# Patient Record
Sex: Female | Born: 1995 | Hispanic: Yes | Marital: Single | Smoking: Never smoker
Health system: Southern US, Community
[De-identification: ages and names within clinical notes are randomized; demographics above are authoritative.]

---

## 2019-05-06 ENCOUNTER — Emergency Department (HOSPITAL_COMMUNITY): Payer: Self-pay

## 2019-05-06 ENCOUNTER — Emergency Department (HOSPITAL_COMMUNITY)
Admission: EM | Admit: 2019-05-06 | Discharge: 2019-05-06 | Disposition: A | Discharge status: Home / Self Care | Payer: Self-pay | Attending: Emergency Medicine | Admitting: Emergency Medicine

## 2019-05-06 ENCOUNTER — Encounter (HOSPITAL_COMMUNITY): Payer: Self-pay | Admitting: Emergency Medicine

## 2019-05-06 ENCOUNTER — Other Ambulatory Visit: Payer: Self-pay

## 2019-05-06 DIAGNOSIS — S8002XA Contusion of left knee, initial encounter: Secondary | ICD-10-CM | POA: Insufficient documentation

## 2019-05-06 DIAGNOSIS — T07XXXA Unspecified multiple injuries, initial encounter: Secondary | ICD-10-CM | POA: Diagnosis present

## 2019-05-06 DIAGNOSIS — S161XXA Strain of muscle, fascia and tendon at neck level, initial encounter: Secondary | ICD-10-CM | POA: Diagnosis not present

## 2019-05-06 DIAGNOSIS — Y999 Unspecified external cause status: Secondary | ICD-10-CM | POA: Diagnosis not present

## 2019-05-06 DIAGNOSIS — Y9301 Activity, walking, marching and hiking: Secondary | ICD-10-CM | POA: Insufficient documentation

## 2019-05-06 DIAGNOSIS — S8001XA Contusion of right knee, initial encounter: Secondary | ICD-10-CM | POA: Insufficient documentation

## 2019-05-06 DIAGNOSIS — Y929 Unspecified place or not applicable: Secondary | ICD-10-CM | POA: Diagnosis not present
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
## 2019-05-06 NOTE — Progress Notes (Signed)
Orthopedic Tech Progress Note Patient Details:  Yesenia Gilbert 1995/08/02 [id number] MD called requesting a soft collar and a pair of crutches for patient Ortho Devices Type of Ortho Device: Crutches, Soft collar Ortho Device/Splint Location: neck Ortho Device/Splint Interventions: Application, Ordered   Post Interventions Patient Tolerated: Ambulated well, Well Instructions Provided: Poper ambulation with device, Care of device, Adjustment of device   Donald Pore 05/06/2019, 7:33 PM

## 2020-07-10 ENCOUNTER — Encounter: Payer: Self-pay | Admitting: Obstetrics & Gynecology

## 2020-07-10 ENCOUNTER — Telehealth: Payer: Self-pay | Admitting: General Practice

## 2020-07-10 ENCOUNTER — Other Ambulatory Visit: Payer: Self-pay

## 2020-07-10 ENCOUNTER — Other Ambulatory Visit (HOSPITAL_COMMUNITY)
Admission: RE | Admit: 2020-07-10 | Discharge: 2020-07-10 | Disposition: A | Discharge status: Home / Self Care | Payer: 59 | Source: Ambulatory Visit | Attending: Obstetrics & Gynecology | Admitting: Obstetrics & Gynecology

## 2020-07-10 ENCOUNTER — Ambulatory Visit (INDEPENDENT_AMBULATORY_CARE_PROVIDER_SITE_OTHER): Payer: 59 | Admitting: Obstetrics & Gynecology

## 2020-07-10 VITALS — BP 121/68 | HR 69 | Ht 63.5 in | Wt 160.0 lb

## 2020-07-10 DIAGNOSIS — Z113 Encounter for screening for infections with a predominantly sexual mode of transmission: Secondary | ICD-10-CM

## 2020-07-10 DIAGNOSIS — Z01419 Encounter for gynecological examination (general) (routine) without abnormal findings: Secondary | ICD-10-CM

## 2020-07-10 DIAGNOSIS — N6320 Unspecified lump in the left breast, unspecified quadrant: Secondary | ICD-10-CM

## 2020-07-10 DIAGNOSIS — Z3009 Encounter for other general counseling and advice on contraception: Secondary | ICD-10-CM

## 2020-07-10 NOTE — Progress Notes (Signed)
Patient has had nexpalnon for six years and wants to discuss a new one vs other birth control. Patient is former patient of [facility]- seeking care here because [facility] is no longer taking new patients. Armandina Stammer RN

## 2020-07-10 NOTE — Telephone Encounter (Signed)
Left message on VM informing patient of breast ultrasound scheduled on 08/04/2020 at 12:40pm with [facility].  Address and phone number was given to patient.

## 2020-07-10 NOTE — Progress Notes (Signed)
Subjective:     Yesenia Gilbert is a 25 y.o. female here for a routine exam.G1P0010  Current complaints: Pt has had Nexplanon for 7 years. She is on her 2nd Nexplanon. 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     Gynecologic History No LMP recorded. Patient has had an implant. Contraception: Nexplanon Last Pap: 4 years prev at [facility]. Results were: normal Last mammogram: 4 years prev. Told that she had a benign mass.   Obstetric History OB History  Gravida Para Term Preterm AB Living  1       1    SAB IAB Ectopic Multiple Live Births               # Outcome Date GA Lbr Len/2nd Weight Sex Delivery Anes PTL Lv  1 AB              The following portions of the patient's history were reviewed and updated as appropriate: allergies, current medications, past family history, past medical history, past social history, past surgical history and problem list.  Review of Systems Pertinent items are noted in HPI.    Objective:  BP 121/68   Pulse 69   Ht 5' 3.5" (1.613 m)   Wt 160 lb (72.6 kg)   BMI 27.90 kg/m  General Appearance:    Alert, cooperative, no distress, appears stated age  Head:    Normocephalic, without obvious abnormality, atraumatic  Eyes:    conjunctiva/corneas clear, EOM's intact, both eyes  Ears:    Normal external ear canals, both ears  Nose:   Nares normal, septum midline, mucosa normal, no drainage    or sinus tenderness  Throat:   Lips, mucosa, and tongue normal; teeth and gums normal  Neck:   Supple, symmetrical, trachea midline, no adenopathy;    thyroid:  no enlargement/tenderness/nodules  Back:     Symmetric, no curvature, ROM normal, no CVA tenderness  Lungs:     respirations unlabored  Chest Wall:    No tenderness or deformity   Heart:    Regular rate and rhythm  Breast Exam:    No tenderness, masses, or nipple abnormality. Mobile well circumscribed mass at 9:00 on the left medial chest wall/breast.    Abdomen:      Soft, non-tender, bowel sounds active all four quadrants,    no masses, no organomegaly  Genitalia:    Normal female without lesion, discharge or tenderness     Extremities:   Extremities normal, atraumatic, no cyanosis or edema  Pulses:   2+ and symmetric all extremities  Skin:   Skin color, texture, turgor normal, no rashes or lesions    Assessment:    Healthy female exam.    Plan:  Yesenia Gilbert was seen today for gynecologic exam.  Diagnoses and all orders for this visit:  Well female exam with routine gynecological exam -     Cytology - PAP( [facility])  Breast mass, left -     US BREAST LTD UNI LEFT INC AXILLA; Future  Encounter for counseling regarding contraception  Routine screening for STI (sexually transmitted infection)  pt to f/u for removal and reinsertion of Nexplanon.   Yesenia Gilbert, M.D., Evern Core

## 2020-07-12 LAB — CYTOLOGY - PAP
Chlamydia: NEGATIVE
Comment: NEGATIVE
Comment: NORMAL
Diagnosis: NEGATIVE
Neisseria Gonorrhea: NEGATIVE

## 2020-08-04 ENCOUNTER — Ambulatory Visit
Admission: RE | Admit: 2020-08-04 | Discharge: 2020-08-04 | Disposition: A | Discharge status: Home / Self Care | Payer: 59 | Source: Ambulatory Visit | Attending: Obstetrics & Gynecology | Admitting: Obstetrics & Gynecology

## 2020-08-04 ENCOUNTER — Other Ambulatory Visit: Payer: Self-pay

## 2020-08-04 DIAGNOSIS — N6320 Unspecified lump in the left breast, unspecified quadrant: Secondary | ICD-10-CM

## 2020-08-11 ENCOUNTER — Other Ambulatory Visit: Payer: Self-pay

## 2020-08-11 ENCOUNTER — Encounter: Payer: Self-pay | Admitting: Family Medicine

## 2020-08-11 ENCOUNTER — Ambulatory Visit (INDEPENDENT_AMBULATORY_CARE_PROVIDER_SITE_OTHER): Payer: 59 | Admitting: Family Medicine

## 2020-08-11 VITALS — BP 116/73 | HR 65 | Wt 160.0 lb

## 2020-08-11 DIAGNOSIS — Z30432 Encounter for removal of intrauterine contraceptive device: Secondary | ICD-10-CM | POA: Diagnosis not present

## 2020-08-11 DIAGNOSIS — Z30430 Encounter for insertion of intrauterine contraceptive device: Secondary | ICD-10-CM

## 2020-08-11 MED ORDER — ETONOGESTREL 68 MG ~~LOC~~ IMPL
68.0000 mg | DRUG_IMPLANT | Freq: Once | SUBCUTANEOUS | Status: AC
  Administered 2020-08-11: 68 mg via SUBCUTANEOUS

## 2020-08-11 NOTE — Progress Notes (Signed)
Nexplanon Removal:  Patient given informed consent for removal of her Implanon, time out was performed.  Signed copy in the chart.  Appropriate time out taken. Implanon site identified.  Area prepped in usual sterile fashon. One cc of 1% lidocaine was used to anesthetize the area at the distal end of the implant. A small stab incision was made right beside the implant on the distal portion.  The implanon rod was grasped using hemostats and removed without difficulty.  There was less than 3 cc blood loss. There were no complications.  A small amount of antibiotic ointment and steri-strips were applied over the small incision.  A pressure bandage was applied to reduce any bruising.  The patient tolerated the procedure well and was given post procedure instructions.  Nexplanon Insertion:  Patient given informed consent, signed copy in the chart, time out was performed.  Appropriate time out taken.  Nexplanon removed form packaging. Device confirmed in needle, then inserted full length of needle and withdrawn per handbook instructions.  Device palpated by physician and patient.  Pt insertion site covered with pressure dressing.   Minimal blood loss.  Pt tolerated the procedure well.

## 2021-06-06 ENCOUNTER — Ambulatory Visit (INDEPENDENT_AMBULATORY_CARE_PROVIDER_SITE_OTHER): Payer: Managed Care, Other (non HMO) | Admitting: Obstetrics & Gynecology

## 2021-06-06 ENCOUNTER — Encounter: Payer: Self-pay | Admitting: Obstetrics & Gynecology

## 2021-06-06 ENCOUNTER — Other Ambulatory Visit: Payer: Self-pay

## 2021-06-06 VITALS — BP 125/76 | HR 75 | Wt 152.0 lb

## 2021-06-06 DIAGNOSIS — R109 Unspecified abdominal pain: Secondary | ICD-10-CM

## 2021-06-06 NOTE — Progress Notes (Signed)
Patient complaining of feeling "off" since getting new Nexplanon. Patient complaining of pain on left side.   Patient also complaining of breast tenderness. Patient had breast imaging last April 2022. Kathrene Alu RN

## 2021-06-06 NOTE — Progress Notes (Signed)
History:  26 y.o. G1P0010 here today for pain in her left side. She reports that she's worried that its related to the Nexplanon. After questioning she recalls that she started working out around the time of the onset of the pain.    The following portions of the patient's history were reviewed and updated as appropriate: allergies, current medications, past family history, past medical history, past social history, past surgical history and problem list.  Review of Systems:  Pertinent items are noted in HPI.    Objective:  Physical Exam Blood pressure 125/76, pulse 75, weight 152 lb (68.9 kg), last menstrual period 04/09/2021.  CONSTITUTIONAL: Well-developed, well-nourished female in no acute distress.  HENT:  Normocephalic, atraumatic EYES: Conjunctivae and EOM are normal. No scleral icterus.  NECK: Normal range of motion SKIN: Skin is warm and dry. No rash noted. Not diaphoretic.No pallor. NEUROLGIC: Alert and oriented to person, place, and time. Normal coordination.  Abd: Soft, nontender and nondistended Pelvic: Normal appearing external genitalia; normal appearing vaginal mucosa and cervix.  Normal discharge.  Small uterus, no other palpable masses, no uterine or adnexal tenderness   Assessment & Plan:  Left side pain. Gyn exam WNL. Nothing that seems related to the Nexplanon.  Pt reassured.   Rec NSAIDS and a heating pad.  F/u prn  Yesenia Gilbert, M.D., [place] Core

## 2021-07-10 ENCOUNTER — Telehealth: Payer: Self-pay

## 2021-07-10 NOTE — Telephone Encounter (Signed)
Pt called stating she thinks she is having pain near her kidneys. Pt states she is not having UTI symptoms. Pt was given the number to [facility]. Understanding was voiced. ?Brei Pociask l Dago Jungwirth, CMA  ?

## 2021-07-10 NOTE — Telephone Encounter (Signed)
-----   Message from Maurine Minister, [place] sent at 07/10/2021  9:19 AM EDT ----- ?Regarding: Request Call Back ?Patient is having pain in lower back and side.  Also requesting referral to nephrologist. ? ?

## 2021-08-08 ENCOUNTER — Ambulatory Visit: Payer: Managed Care, Other (non HMO) | Admitting: Obstetrics & Gynecology

## 2022-01-26 IMAGING — US US BREAST*L* LIMITED INC AXILLA
1 series · 11 of 11 positions shown · non-contrast
Comparison: None.

CLINICAL DATA: Patient presents with a lump along the medial margin
of the left breast. She reports that this has been present for years
and that she had evaluated in [HOSPITAL] in 8778, and was told that
it was a cyst. At this time, there no prior images available [REDACTED] or elsewhere for this patient.

EXAM:
ULTRASOUND OF THE LEFT BREAST

[Series 1: us breast*left* limited inc axilla · 0.06mm/px · 11 of 11 slices shown]
[im 1/11]
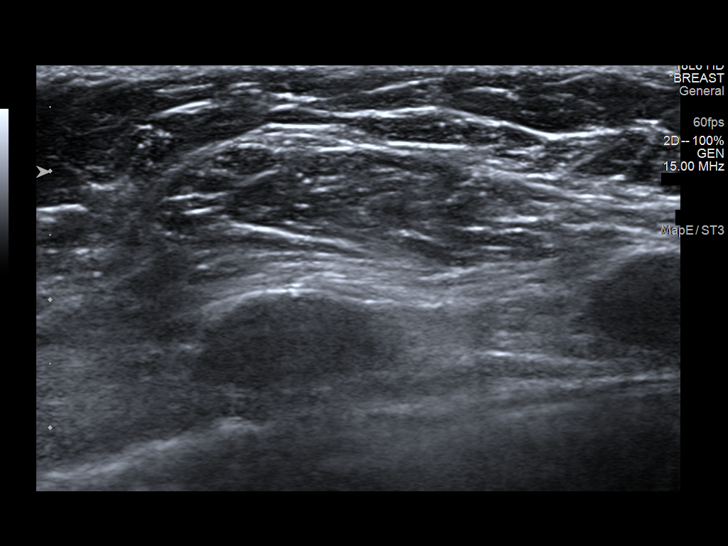
[im 2/11]
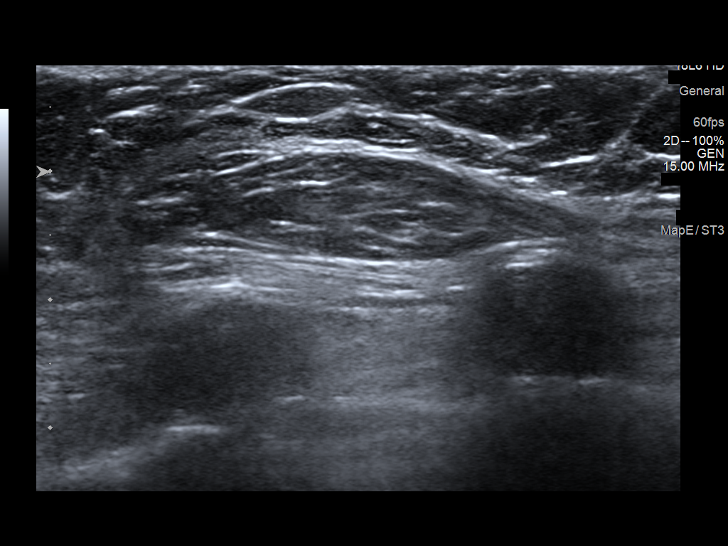
[im 3/11]
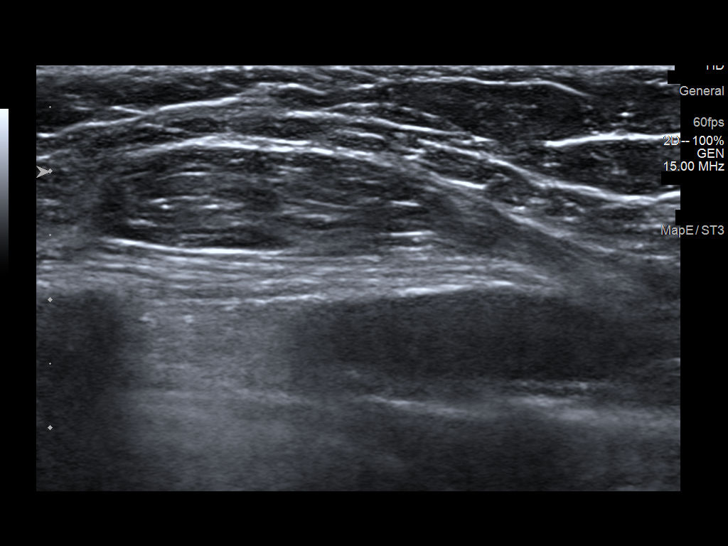
[im 4/11]
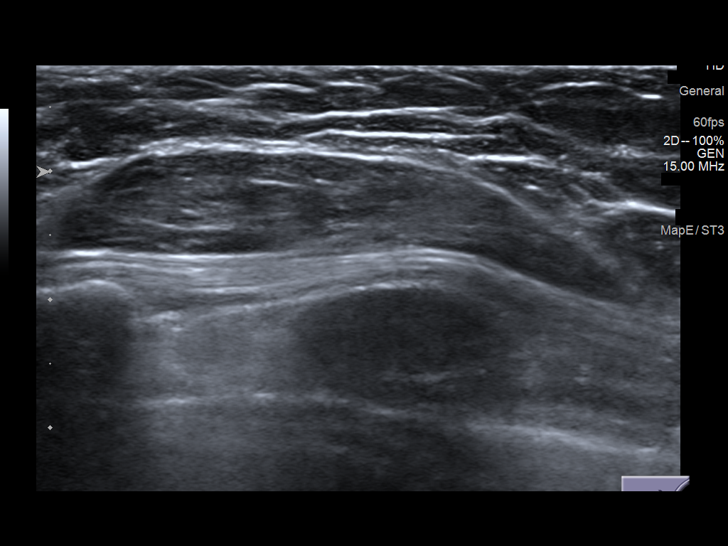
[im 5/11]
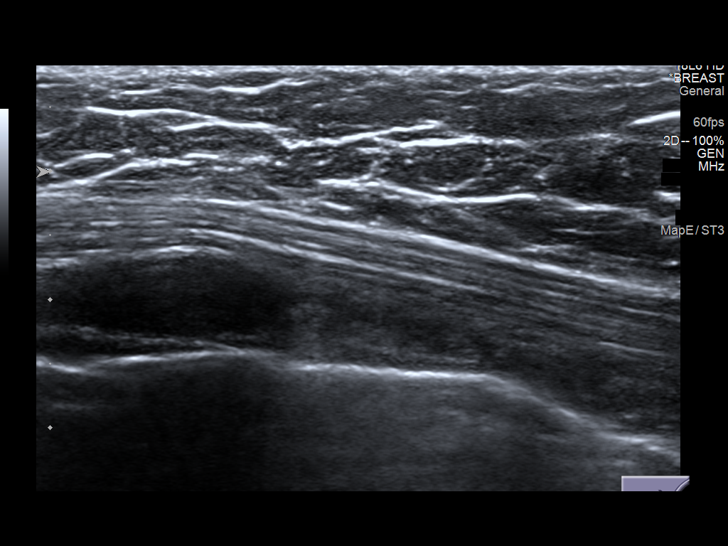
[im 6/11]
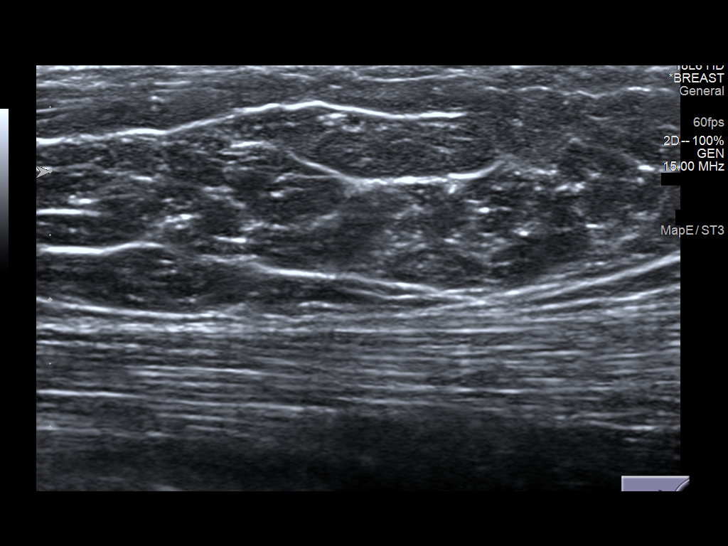
[im 7/11]
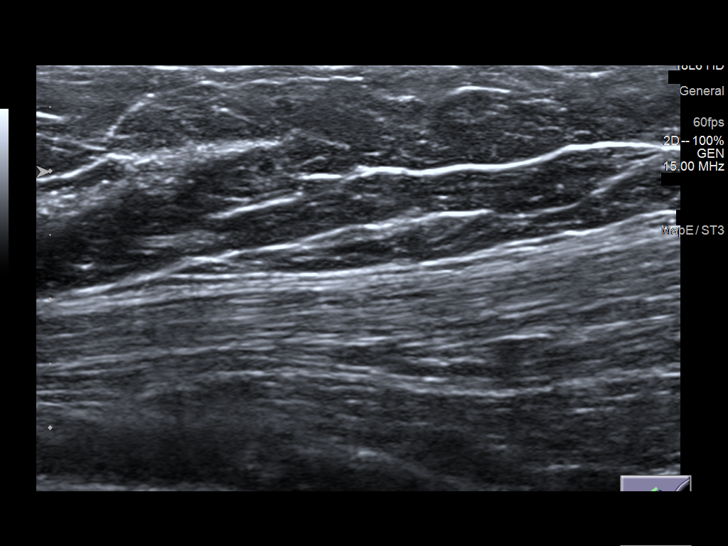
[im 8/11]
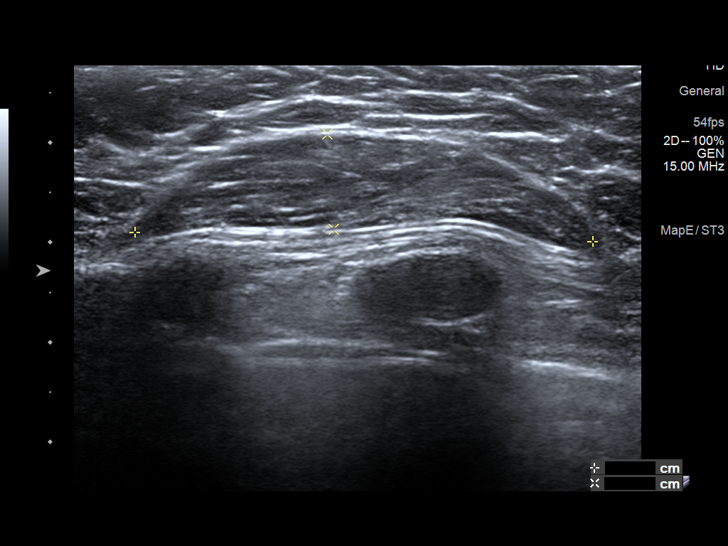
[im 9/11]
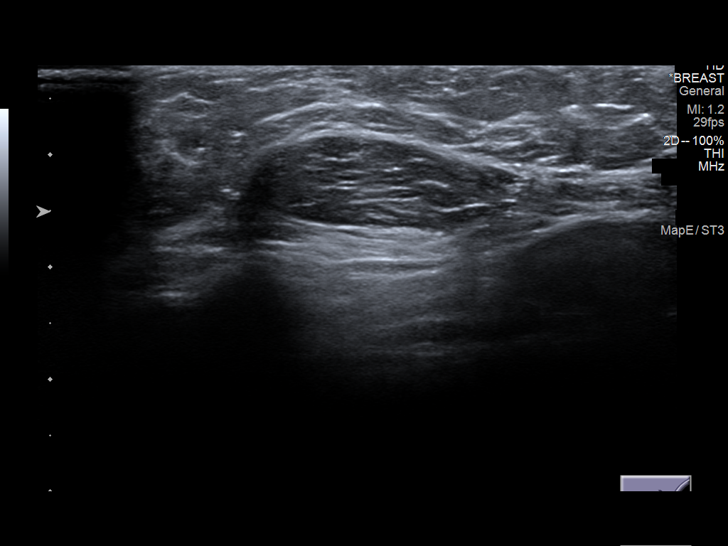
[im 10/11]
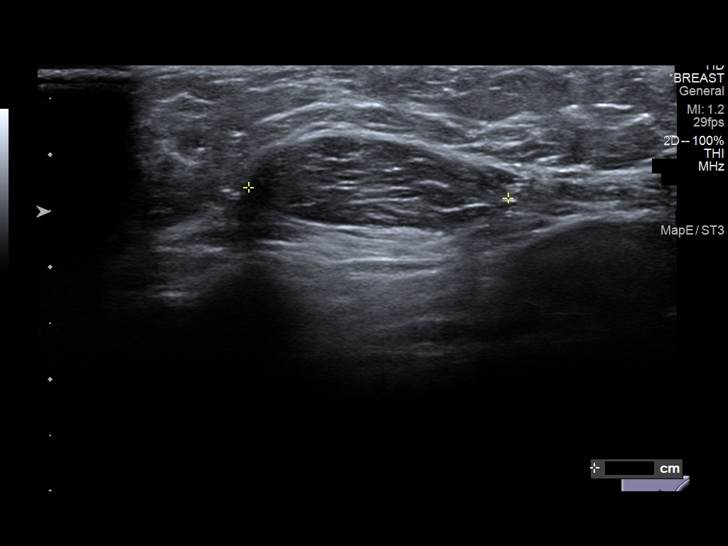
[im 11/11]
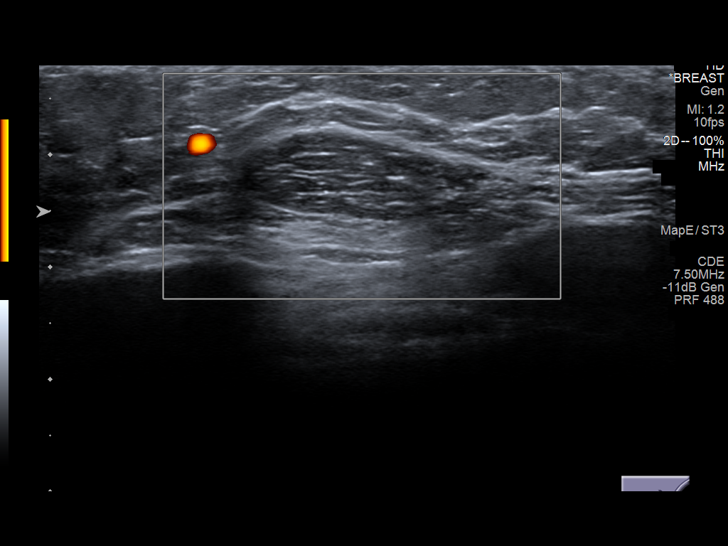

[11 of 11 positions shown; findings below may reference images not displayed]

FINDINGS: On physical exam, there is a soft elongated mass along the medial
margin of the left breast adjacent to the sternum.

Targeted ultrasound is performed, showing an elongated oval,
parallel and circumscribed mass in the left breast at 9 o'clock, 9
cm the nipple, just above the medial margin of the pectoralis
muscle, measuring 4.6 x 1.0 x 2.3 cm. This corresponds to the
palpable abnormality.
IMPRESSION: 1. Benign mass along the medial aspect of the left breast adjacent
to the sternum, most likely a lipoma, less likely a sternalis
accessory muscle. Based on the history and the soft consistency of
this mass on exam, no follow-up is recommended.

RECOMMENDATION:
1. Screening mammogram at age 40 unless there are persistent or
intervening clinical concerns. (Code:LA-C-BO8)
2. If the palpable mass appears to be enlarging, repeat left breast
imaging, possibly to include mammography, would be indicated.

I have discussed the findings and recommendations with the patient.
If applicable, a reminder letter will be sent to the patient
regarding the next appointment.

BI-RADS CATEGORY  2: Benign.

## 2023-01-08 ENCOUNTER — Ambulatory Visit: Payer: Managed Care, Other (non HMO) | Admitting: Obstetrics & Gynecology

## 2023-04-07 ENCOUNTER — Encounter (HOSPITAL_BASED_OUTPATIENT_CLINIC_OR_DEPARTMENT_OTHER): Payer: Self-pay

## 2023-04-07 ENCOUNTER — Ambulatory Visit: Payer: BC Managed Care – PPO | Admitting: Family Medicine

## 2023-04-07 ENCOUNTER — Other Ambulatory Visit: Payer: Self-pay

## 2023-04-07 ENCOUNTER — Emergency Department (HOSPITAL_BASED_OUTPATIENT_CLINIC_OR_DEPARTMENT_OTHER)
Admission: EM | Admit: 2023-04-07 | Discharge: 2023-04-07 | Disposition: A | Discharge status: Home / Self Care | Payer: Self-pay | Attending: Emergency Medicine | Admitting: Emergency Medicine

## 2023-04-07 DIAGNOSIS — L299 Pruritus, unspecified: Secondary | ICD-10-CM | POA: Insufficient documentation

## 2023-04-07 DIAGNOSIS — H9202 Otalgia, left ear: Secondary | ICD-10-CM | POA: Insufficient documentation

## 2023-04-07 MED ORDER — CETIRIZINE HCL 10 MG PO TABS: 10.0000 mg | ORAL_TABLET | Freq: Every day | ORAL | 0 refills | Status: AC

## 2023-04-07 MED ORDER — FLUTICASONE PROPIONATE 50 MCG/ACT NA SUSP: 2.0000 | Freq: Every day | NASAL | 2 refills | Status: AC

## 2023-04-07 NOTE — ED Triage Notes (Signed)
Pt reports that she has had bilateral ear pain and drainage out of her left ear. Only hurts at night

## 2023-04-07 NOTE — ED Provider Notes (Signed)
[facility] Provider Note   CSN: [id number] Arrival date & time: 04/07/23  1648     History  Chief Complaint  Patient presents with   Otalgia    Yesenia Gilbert is a 27 y.o. female.  Patient is a 27 year old female who presents with itching of her ears.  She says been going on for about a month.  She says it is in both of her ears.  She has had some drainage out of her left ear.  She feels like it itching in the canals.  She denies any other rashes.  No significant runny nose or congestion.  No fevers.  No injuries to the ears.       Home Medications Prior to Admission medications   Medication Sig Start Date End Date Taking? Authorizing Provider  cetirizine (ZYRTEC ALLERGY) 10 MG tablet Take 1 tablet (10 mg total) by mouth daily. 04/07/23  Yes Rolan Bucco, MD  fluticasone (FLONASE) 50 MCG/ACT nasal spray Place 2 sprays into both nostrils daily. 04/07/23  Yes Rolan Bucco, MD  doxycycline (ADOXA) 50 MG tablet Take 50 mg by mouth 2 (two) times daily. Patient not taking: Reported on 06/06/2021    [provider]  etonogestrel (NEXPLANON) 68 MG IMPL implant 1 each by Subdermal route once.    [provider]      Allergies    Patient has no known allergies.    Review of Systems   Review of Systems  Constitutional:  Negative for fever.  HENT:  Positive for ear pain. Negative for congestion, facial swelling, rhinorrhea and sinus pain.   Respiratory:  Negative for cough and shortness of breath.   Gastrointestinal:  Negative for nausea and vomiting.  Skin:  Negative for rash.  Neurological:  Negative for headaches.    Physical Exam Updated Vital Signs BP (!) 135/101   Pulse 85   Temp 98.3 F (36.8 C) (Oral)   Ht 5\' 4"  (1.626 m)   Wt 68 kg   LMP 03/13/2023   SpO2 100%   BMI 25.75 kg/m  Physical Exam HENT:     Head: Normocephalic and atraumatic.     Ears:     Comments: Your canals look normal.   There is minimal wax in the canals.  No rashes noted.  No redness is noted.  No drainage is noted.  No foreign bodies are noted.  TMs appear intact.  There is little bit of clear fluid behind the TMs but no suggestions of infection. Cardiovascular:     Rate and Rhythm: Normal rate.  Pulmonary:     Effort: Pulmonary effort is normal.  Skin:    General: Skin is warm and dry.  Neurological:     Mental Status: She is alert.     ED Results / Procedures / Treatments   Labs (all labs ordered are listed, but only abnormal results are displayed) Labs Reviewed - No data to display  EKG None  Radiology No results found.  Procedures Procedures    Medications Ordered in ED Medications - No data to display  ED Course/ Medical Decision Making/ A&P                                 Medical Decision Making Risk OTC drugs.   Patient is a 27 year old who presents with itching of her ears.  Her ear exam is nonconcerning.  She does not  have any suggestions of infection in either of the canals or of the middle ear.  I do not see any rashes or lesions.  No drainage is noted.  No foreign bodies are noted.  It is unclear what is causing this itching.  Potentially it is from middle ear effusion.  Will try Flonase and Zyrtec.  Will refer her to follow-up with ENT if her symptoms are improving.  Return precautions were given.  Final Clinical Impression(s) / ED Diagnoses Final diagnoses:  Ear itching    Rx / DC Orders ED Discharge Orders          Ordered    cetirizine (ZYRTEC ALLERGY) 10 MG tablet  Daily        04/07/23 1807    fluticasone (FLONASE) 50 MCG/ACT nasal spray  Daily        04/07/23 1807              Rolan Bucco, MD 04/07/23 1811
